# Patient Record
Sex: Female | Born: 2000 | Race: White | Hispanic: No | Marital: Single | Smoking: Never smoker
Health system: Southern US, Community
[De-identification: ages and names within clinical notes are randomized; demographics above are authoritative.]

## PROBLEM LIST (undated history)

## (undated) DIAGNOSIS — D6851 Activated protein C resistance: Secondary | ICD-10-CM

## (undated) DIAGNOSIS — N926 Irregular menstruation, unspecified: Secondary | ICD-10-CM

## (undated) HISTORY — DX: Irregular menstruation, unspecified: N92.6

## (undated) HISTORY — PX: KNEE SURGERY: SHX244

---
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
## 2019-02-08 ENCOUNTER — Other Ambulatory Visit: Payer: Self-pay

## 2019-02-08 ENCOUNTER — Encounter: Payer: Self-pay | Admitting: Emergency Medicine

## 2019-02-08 DIAGNOSIS — Z793 Long term (current) use of hormonal contraceptives: Secondary | ICD-10-CM | POA: Diagnosis not present

## 2019-02-08 DIAGNOSIS — T7840XA Allergy, unspecified, initial encounter: Secondary | ICD-10-CM | POA: Diagnosis not present

## 2019-02-08 DIAGNOSIS — R220 Localized swelling, mass and lump, head: Secondary | ICD-10-CM | POA: Diagnosis present

## 2019-02-08 MED ORDER — PREDNISONE 20 MG PO TABS
60.0000 mg | ORAL_TABLET | Freq: Once | ORAL | Status: AC
  Administered 2019-02-08: 60 mg via ORAL

## 2019-02-08 MED ORDER — DIPHENHYDRAMINE HCL 25 MG PO CAPS
25.0000 mg | ORAL_CAPSULE | Freq: Once | ORAL | Status: AC
  Administered 2019-02-08: 25 mg via ORAL

## 2019-02-08 MED ORDER — DIPHENHYDRAMINE HCL 25 MG PO CAPS
ORAL_CAPSULE | ORAL | Status: AC
  Filled 2019-02-08: qty 1

## 2019-02-08 NOTE — ED Triage Notes (Signed)
Pt in with co itching and puffiness to face. States around eyes and forehead. No shob noted, took 1 benadryl around 2100, unsure of cause.

## 2019-02-09 ENCOUNTER — Emergency Department
Admission: EM | Admit: 2019-02-09 | Discharge: 2019-02-09 | Disposition: A | Discharge status: Home / Self Care | Payer: BC Managed Care – PPO | Attending: Emergency Medicine | Admitting: Emergency Medicine

## 2019-02-09 DIAGNOSIS — T7840XA Allergy, unspecified, initial encounter: Secondary | ICD-10-CM

## 2019-02-09 NOTE — ED Provider Notes (Signed)
[facility] Emergency Department Provider Note  ____________________________________________  Time seen: Approximately 3:37 AM  I have reviewed the triage vital signs and the nursing notes.   HISTORY  Chief Complaint Allergic Reaction   HPI Kimberly Bradford is a 18 y.o. female who presents for evaluation of an allergic reaction.  Patient reports that started at 8 PM.  She noticed that her lips were swollen, her face looked puffy and itchy and she started having throat closing sensation.  She took 1 Benadryl.  30 minutes later she had dinner.  Her symptoms got slightly worse and she took a second Benadryl at 9 PM.  She reports full resolution of her symptoms at this time.  No prior history of anaphylactic reaction.  She denies shortness of breath or vomiting or hives.  She denies any new foods, medications, make-up, lotions.   Past Medical History:  Diagnosis Date  . Irregular periods     Patient Active Problem List   Diagnosis Date Noted  . Factor 5 Leiden mutation, heterozygous ([place]) 10/20/2017    Past Surgical History:  Procedure Laterality Date  . KNEE SURGERY      Prior to Admission medications   Medication Sig Start Date End Date Taking? Authorizing Provider  Levonorgestrel-Ethinyl Estradiol (AMETHIA,CAMRESE) 0.1-0.02 & 0.01 MG tablet Take 1 tablet by mouth daily. 10/12/17   Joylene Igo, CNM    Allergies Patient has no known allergies.  No family history on file.  Social History Social History   Tobacco Use  . Smoking status: Never Smoker  . Smokeless tobacco: Current User  Substance Use Topics  . Alcohol use: Not Currently  . Drug use: Not Currently    Review of Systems  Constitutional: Negative for fever. Eyes: Negative for visual changes. ENT: Negative for sore throat. + Throat closing sensation and lip swelling Neck: No neck pain  Cardiovascular: Negative for chest pain. Respiratory: Negative for shortness of  breath. Gastrointestinal: Negative for abdominal pain, vomiting or diarrhea. Genitourinary: Negative for dysuria. Musculoskeletal: Negative for back pain. Skin: Negative for rash. Neurological: Negative for headaches, weakness or numbness. Psych: No SI or HI  ____________________________________________   PHYSICAL EXAM:  VITAL SIGNS: ED Triage Vitals  Enc Vitals Group     BP 02/08/19 2339 116/79     Pulse Rate 02/08/19 2339 84     Resp 02/08/19 2339 16     Temp 02/08/19 2339 98.7 F (37.1 C)     Temp Source 02/08/19 2339 Oral     SpO2 02/08/19 2339 100 %     Weight 02/08/19 2339 110 lb (49.9 kg)     Height 02/08/19 2339 5\' 6"  (1.676 m)     Head Circumference --      Peak Flow --      Pain Score 02/08/19 2343 0     Pain Loc --      Pain Edu? --      Excl. in [place]? --     Constitutional: Alert and oriented. Well appearing and in no apparent distress. HEENT:      Head: Normocephalic and atraumatic.         Eyes: Conjunctivae are normal. Sclera is non-icteric.       Mouth/Throat: Mucous membranes are moist.  Oropharynx is clear with no tonsillar enlargement, uvula and tongue are normal, no stridor, airways patent, handling saliva with no difficulty, no angioedema      Neck: Supple with no signs of meningismus. Cardiovascular: Regular rate  and rhythm. No murmurs, gallops, or rubs. 2+ symmetrical distal pulses are present in all extremities. No JVD. Respiratory: Normal respiratory effort. Lungs are clear to auscultation bilaterally. No wheezes, crackles, or rhonchi.  Gastrointestinal: Soft, non tender, and non distended with positive bowel sounds. No rebound or guarding. Musculoskeletal: Nontender with normal range of motion in all extremities. No edema, cyanosis, or erythema of extremities. Neurologic: Normal speech and language. Face is symmetric. Moving all extremities. No gross focal neurologic deficits are appreciated. Skin: Skin is warm, dry and intact. No rash noted.  Psychiatric: Mood and affect are normal. Speech and behavior are normal.  ____________________________________________   LABS (all labs ordered are listed, but only abnormal results are displayed)  Labs Reviewed - No data to display ____________________________________________  EKG  none  ____________________________________________  RADIOLOGY  none  ____________________________________________   PROCEDURES  Procedure(s) performed: None Procedures Critical Care performed:  None ____________________________________________   INITIAL IMPRESSION / ASSESSMENT AND PLAN / ED COURSE   18 y.o. female who presents for evaluation of an allergic reaction to unknown allergen.  Symptoms resolved after 2 Benadryl.  Patient was monitored for 4 hours post Benadryl usage with no recurrent of her symptoms.  She was discharged home with an EpiPen.  Discussed indications for EpiPen usage.  Recommended follow-up with PCP for referral to an allergist.  Discussed my standard return precautions       As part of my medical decision making, I reviewed the following data within the electronic MEDICAL RECORD NUMBER Nursing notes reviewed and incorporated, Old chart reviewed, Notes from prior ED visits and [place] Controlled Substance Database   Please note:  Patient was evaluated in Emergency Department today for the symptoms described in the history of present illness. Patient was evaluated in the context of the global COVID-19 pandemic, which necessitated consideration that the patient might be at risk for infection with the SARS-CoV-2 virus that causes COVID-19. Institutional protocols and algorithms that pertain to the evaluation of patients at risk for COVID-19 are in a state of rapid change based on information released by regulatory bodies including the CDC and federal and state organizations. These policies and algorithms were followed during the patient's care in the ED.  Some ED evaluations and  interventions may be delayed as a result of limited staffing during the pandemic.   ____________________________________________   FINAL CLINICAL IMPRESSION(S) / ED DIAGNOSES   Final diagnoses:  Allergic reaction, initial encounter      NEW MEDICATIONS STARTED DURING THIS VISIT:  ED Discharge Orders    None       Note:  This document was prepared using Dragon voice recognition software and may include unintentional dictation errors.    Nita Sickle, MD 02/09/19 [phone]

## 2019-03-13 ENCOUNTER — Ambulatory Visit: Payer: BC Managed Care – PPO | Attending: Internal Medicine

## 2019-03-13 DIAGNOSIS — Z20822 Contact with and (suspected) exposure to covid-19: Secondary | ICD-10-CM | POA: Insufficient documentation

## 2019-03-14 LAB — NOVEL CORONAVIRUS, NAA: SARS-CoV-2, NAA: NOT DETECTED

## 2019-06-29 ENCOUNTER — Encounter: Payer: Self-pay | Admitting: Emergency Medicine

## 2019-06-29 ENCOUNTER — Other Ambulatory Visit: Payer: Self-pay

## 2019-06-29 ENCOUNTER — Emergency Department: Payer: BC Managed Care – PPO

## 2019-06-29 ENCOUNTER — Emergency Department
Admission: EM | Admit: 2019-06-29 | Discharge: 2019-06-29 | Disposition: A | Discharge status: Home / Self Care | Payer: BC Managed Care – PPO | Attending: Emergency Medicine | Admitting: Emergency Medicine

## 2019-06-29 DIAGNOSIS — R208 Other disturbances of skin sensation: Secondary | ICD-10-CM | POA: Diagnosis present

## 2019-06-29 DIAGNOSIS — M62830 Muscle spasm of back: Secondary | ICD-10-CM | POA: Diagnosis not present

## 2019-06-29 DIAGNOSIS — M5416 Radiculopathy, lumbar region: Secondary | ICD-10-CM | POA: Diagnosis not present

## 2019-06-29 DIAGNOSIS — R202 Paresthesia of skin: Secondary | ICD-10-CM | POA: Diagnosis not present

## 2019-06-29 DIAGNOSIS — M79604 Pain in right leg: Secondary | ICD-10-CM | POA: Diagnosis not present

## 2019-06-29 DIAGNOSIS — F17228 Nicotine dependence, chewing tobacco, with other nicotine-induced disorders: Secondary | ICD-10-CM | POA: Diagnosis not present

## 2019-06-29 DIAGNOSIS — R200 Anesthesia of skin: Secondary | ICD-10-CM | POA: Diagnosis not present

## 2019-06-29 HISTORY — DX: Activated protein C resistance: D68.51

## 2019-06-29 LAB — COMPREHENSIVE METABOLIC PANEL WITH GFR
ALT: 12 U/L (ref 0–44)
AST: 18 U/L (ref 15–41)
Albumin: 5.1 g/dL — ABNORMAL HIGH (ref 3.5–5.0)
Alkaline Phosphatase: 36 U/L — ABNORMAL LOW (ref 38–126)
Anion gap: 8 (ref 5–15)
BUN: 10 mg/dL (ref 6–20)
CO2: 26 mmol/L (ref 22–32)
Calcium: 9.5 mg/dL (ref 8.9–10.3)
Chloride: 105 mmol/L (ref 98–111)
Creatinine, Ser: 0.59 mg/dL (ref 0.44–1.00)
GFR calc Af Amer: >60 mL/min
GFR calc non Af Amer: >60 mL/min
Glucose, Bld: 91 mg/dL (ref 70–99)
Potassium: 3.9 mmol/L (ref 3.5–5.1)
Sodium: 139 mmol/L (ref 135–145)
Total Bilirubin: 0.8 mg/dL (ref 0.3–1.2)
Total Protein: 8 g/dL (ref 6.5–8.1)

## 2019-06-29 LAB — CBC WITH DIFFERENTIAL/PLATELET
Abs Immature Granulocytes: 0.02 10*3/uL (ref 0.00–0.07)
Basophils Absolute: 0.1 10*3/uL (ref 0.0–0.1)
Basophils Relative: 1 %
Eosinophils Absolute: 0.2 10*3/uL (ref 0.0–0.5)
Eosinophils Relative: 3 %
HCT: 37.9 % (ref 36.0–46.0)
Hemoglobin: 12.5 g/dL (ref 12.0–15.0)
Immature Granulocytes: 0 %
Lymphocytes Relative: 19 %
Lymphs Abs: 1.1 10*3/uL (ref 0.7–4.0)
MCH: 28.7 pg (ref 26.0–34.0)
MCHC: 33 g/dL (ref 30.0–36.0)
MCV: 87.1 fL (ref 80.0–100.0)
Monocytes Absolute: 0.7 10*3/uL (ref 0.1–1.0)
Monocytes Relative: 12 %
Neutro Abs: 3.8 10*3/uL (ref 1.7–7.7)
Neutrophils Relative %: 65 %
Platelets: 322 10*3/uL (ref 150–400)
RBC: 4.35 MIL/uL (ref 3.87–5.11)
RDW: 11.9 % (ref 11.5–15.5)
WBC: 5.8 10*3/uL (ref 4.0–10.5)
nRBC: 0 % (ref 0.0–0.2)

## 2019-06-29 MED ORDER — PREDNISONE 10 MG PO TABS: 10.0000 mg | ORAL_TABLET | Freq: Every day | ORAL | 0 refills | Status: AC

## 2019-06-29 MED ORDER — ONDANSETRON HCL 4 MG/2ML IJ SOLN
4.0000 mg | Freq: Once | INTRAMUSCULAR | Status: AC
  Administered 2019-06-29: 4 mg via INTRAVENOUS
  Filled 2019-06-29: qty 2

## 2019-06-29 MED ORDER — DEXAMETHASONE SODIUM PHOSPHATE 10 MG/ML IJ SOLN
10.0000 mg | Freq: Once | INTRAMUSCULAR | Status: AC
  Administered 2019-06-29: 10 mg via INTRAVENOUS
  Filled 2019-06-29: qty 1

## 2019-06-29 MED ORDER — MORPHINE SULFATE (PF) 4 MG/ML IV SOLN
4.0000 mg | Freq: Once | INTRAVENOUS | Status: AC
  Administered 2019-06-29: 4 mg via INTRAVENOUS
  Filled 2019-06-29: qty 1

## 2019-06-29 MED ORDER — ORPHENADRINE CITRATE 30 MG/ML IJ SOLN
60.0000 mg | Freq: Once | INTRAMUSCULAR | Status: AC
  Administered 2019-06-29: 60 mg via INTRAVENOUS
  Filled 2019-06-29: qty 2

## 2019-06-29 MED ORDER — CYCLOBENZAPRINE HCL 5 MG PO TABS: 5.0000 mg | ORAL_TABLET | Freq: Three times a day (TID) | ORAL | 0 refills | Status: AC | PRN

## 2019-06-29 NOTE — Discharge Instructions (Addendum)
Please alternate Tylenol and ibuprofen every 6 hours as needed.  You may use the muscle relaxer Flexeril 3 times daily as needed.  This medication will make you sleepy so do not drive while taking this medication.  If you continue to have the numbness tingling and radicular pain down the right leg tomorrow or over the next few days, you can start the prednisone taper.  If no numbness or tingling hold off on taking prednisone taper.  Return to the ER for any severe pain, inability to walk, weakness, loss of bowel or bladder function.  Follow-up with primary care provider in 1 week if symptoms have not resolved.

## 2019-06-29 NOTE — ED Provider Notes (Signed)
[facility] EMERGENCY DEPARTMENT Provider Note   CSN: [id number] Arrival date & time: 06/29/19  1417     History Chief Complaint  Patient presents with  . Leg Pain    Kimberly Bradford is a 19 y.o. female presents to the emergency department for evaluation of burning numbness and tingling in the right posterior lateral hip, anterior knee, lateral calf and into the big toe.  Symptoms began this morning.  She has a history of factor V Leiden clotting disorder as well as reports receiving Pfizer Covid vaccine yesterday.  She is concerned about a blood clot.  She denies any chest pain, shortness of breath or lower extremity swelling.  She has not had a medications for pain.  Her pain is moderate to severe.  She denies any trauma or injury or previous back surgery.  No history of sciatica.  No loss of bowel or bladder symptoms.  No fevers.  She does describe several days of tightness in her right lower back without any preceding trauma or injury.  HPI     Past Medical History:  Diagnosis Date  . Factor V Leiden mutation (HCC)   . Irregular periods     Patient Active Problem List   Diagnosis Date Noted  . Factor 5 Leiden mutation, heterozygous (HCC) 10/20/2017    Past Surgical History:  Procedure Laterality Date  . KNEE SURGERY       OB History    Gravida  0   Para  0   Term  0   Preterm  0   AB  0   Living  0     SAB  0   TAB  0   Ectopic  0   Multiple  0   Live Births  0           No family history on file.  Social History   Tobacco Use  . Smoking status: Never Smoker  . Smokeless tobacco: Current User  Substance Use Topics  . Alcohol use: Not Currently  . Drug use: Not Currently    Home Medications Prior to Admission medications   Medication Sig Start Date End Date Taking? Authorizing Provider  cyclobenzaprine (FLEXERIL) 5 MG tablet Take 1-2 tablets (5-10 mg total) by mouth 3 (three) times daily as needed for  muscle spasms. 06/29/19   Evon Slack, PA-C  Levonorgestrel-Ethinyl Estradiol (AMETHIA,CAMRESE) 0.1-0.02 & 0.01 MG tablet Take 1 tablet by mouth daily. 10/12/17   Shambley, Melody N, CNM  predniSONE (DELTASONE) 10 MG tablet Take 1 tablet (10 mg total) by mouth daily. 6,5,4,3,2,1 six day taper 06/29/19   Evon Slack, PA-C    Allergies    Patient has no known allergies.  Review of Systems   Review of Systems  Constitutional: Negative for chills and fever.  Respiratory: Negative for cough and shortness of breath.   Cardiovascular: Negative for chest pain and leg swelling.  Gastrointestinal: Negative for abdominal pain, nausea and vomiting.  Musculoskeletal: Positive for back pain, gait problem and myalgias.  Neurological: Positive for numbness. Negative for weakness.    Physical Exam Updated Vital Signs BP 132/65 (BP Location: Left Arm)   Pulse 93   Temp 98.4 F (36.9 C) (Oral)   Resp 16   Ht 5\' 6"  (1.676 m)   Wt 49 kg   SpO2 100%   BMI 17.43 kg/m   Physical Exam Constitutional:      Appearance: She is well-developed.  HENT:  Head: Normocephalic and atraumatic.  Eyes:     Conjunctiva/sclera: Conjunctivae normal.  Cardiovascular:     Rate and Rhythm: Normal rate.  Pulmonary:     Effort: Pulmonary effort is normal. No respiratory distress.  Musculoskeletal:        General: Normal range of motion.     Cervical back: Normal range of motion.     Comments: Patient with tenderness along the right paravertebral muscles of the lumbar spine.  No spinous process tenderness.  Able to assist with hip flexion and knee flexion as well as maintain active knee extension.  She has normal ankle plantar flexion dorsiflexion.  Slightly diminished sensation along the top of the right foot.  There is no swelling warmth erythema or edema throughout both lower extremities.  No knee effusion.  Hip moves well with internal X rotation with very little discomfort.  Skin:    General: Skin is  warm.     Findings: No rash.  Neurological:     Mental Status: She is alert and oriented to person, place, and time.  Psychiatric:        Behavior: Behavior normal.        Thought Content: Thought content normal.     ED Results / Procedures / Treatments   Labs (all labs ordered are listed, but only abnormal results are displayed) Labs Reviewed  COMPREHENSIVE METABOLIC PANEL - Abnormal; Notable for the following components:      Result Value   Albumin 5.1 (*)    Alkaline Phosphatase 36 (*)    All other components within normal limits  CBC WITH DIFFERENTIAL/PLATELET    EKG None  Radiology US Venous Img Lower Unilateral Right  Result Date: 06/29/2019 CLINICAL DATA:  Shooting pain in the RIGHT leg EXAM: RIGHT LOWER EXTREMITY VENOUS DOPPLER ULTRASOUND TECHNIQUE: Gray-scale sonography with compression, as well as color and duplex ultrasound, were performed to evaluate the deep venous system(s) from the level of the common femoral vein through the popliteal and proximal calf veins. COMPARISON:  None. FINDINGS: VENOUS Normal compressibility of the common femoral, superficial femoral, and popliteal veins, as well as the visualized calf veins. Visualized portions of profunda femoral vein and great saphenous vein unremarkable. No filling defects to suggest DVT on grayscale or color Doppler imaging. Doppler waveforms show normal direction of venous flow, normal respiratory plasticity and response to augmentation. Limited views of the contralateral common femoral vein are unremarkable. OTHER None. Limitations: none IMPRESSION: Negative. Electronically Signed   By: Nolon Nations M.D.   On: 06/29/2019 15:29    Procedures Procedures (including critical care time)  Medications Ordered in ED Medications  ondansetron (ZOFRAN) injection 4 mg (4 mg Intravenous Given 06/29/19 1620)  morphine 4 MG/ML injection 4 mg (4 mg Intravenous Given 06/29/19 1623)  orphenadrine (NORFLEX) injection 60 mg (60 mg  Intravenous Given 06/29/19 1621)  dexamethasone (DECADRON) injection 10 mg (10 mg Intravenous Given 06/29/19 1624)    ED Course  I have reviewed the triage vital signs and the nursing notes.  Pertinent labs & imaging results that were available during my care of the patient were reviewed by me and considered in my medical decision making (see chart for details).    MDM Rules/Calculators/A&P                      19 year old female with right leg pain.  History and exam consistent with lumbar radiculopathy with right lower lumbar muscle spasms.  Due to factor V Leiden  and recent Covid and vaccination yesterday ultrasound of the lower extremity was obtained and negative for DVT.  CBC and BMP normal.  She is given IV medications for pain, muscle laxation and anti-inflammatory relief, all symptoms resolved.  She is able to stand and walk and bend with no pain or discomfort.  Post medication examination shows no weakness in the lower extremity, full active range of motion with no neurological deficits.  She will go home with mother, take Tylenol and ibuprofen as needed.  Flexeril as needed for muscle spasms and if any continued numbness and tingling tomorrow or over the next few days she can start prednisone taper.  They understand signs and symptoms return to ED for. Final Clinical Impression(s) / ED Diagnoses Final diagnoses:  Right lumbar radiculopathy  Spasm of lumbar paraspinous muscle    Rx / DC Orders ED Discharge Orders         Ordered    predniSONE (DELTASONE) 10 MG tablet  Daily     06/29/19 1713    cyclobenzaprine (FLEXERIL) 5 MG tablet  3 times daily PRN     06/29/19 1713           Ronnette Juniper 06/29/19 1716    Concha Se, MD 06/29/19 [phone]

## 2019-06-29 NOTE — ED Notes (Signed)
Labs and imaging ordered per Dr. Cyril Loosen

## 2019-06-29 NOTE — ED Triage Notes (Addendum)
Patient reports she has had pain, numbness and tingling to right leg since this morning. No known injury. States she has a clotting disorder and UC sent her here for DVT r/o. No history of clots. Pain worse with flexing foot. Pedal pulse palpated.   Patient states she go her second dose of Pfizer vaccine yesterday.

## 2019-06-29 NOTE — ED Notes (Signed)
See triage note  Presents with pain to right leg    She is having tingling and numbness   States main pt is in lower leg  But now is moving into entire leg  Unable to bear wt

## 2020-02-02 ENCOUNTER — Emergency Department
Admission: EM | Admit: 2020-02-02 | Discharge: 2020-02-02 | Disposition: A | Discharge status: Home / Self Care | Payer: BC Managed Care – PPO | Attending: Emergency Medicine | Admitting: Emergency Medicine

## 2020-02-02 ENCOUNTER — Encounter: Payer: Self-pay | Admitting: Emergency Medicine

## 2020-02-02 ENCOUNTER — Other Ambulatory Visit: Payer: Self-pay

## 2020-02-02 DIAGNOSIS — X58XXXA Exposure to other specified factors, initial encounter: Secondary | ICD-10-CM | POA: Insufficient documentation

## 2020-02-02 DIAGNOSIS — M7981 Nontraumatic hematoma of soft tissue: Secondary | ICD-10-CM | POA: Insufficient documentation

## 2020-02-02 DIAGNOSIS — T45515A Adverse effect of anticoagulants, initial encounter: Secondary | ICD-10-CM | POA: Insufficient documentation

## 2020-02-02 DIAGNOSIS — T148XXA Other injury of unspecified body region, initial encounter: Secondary | ICD-10-CM

## 2020-02-02 LAB — BASIC METABOLIC PANEL
Anion gap: 11 (ref 5–15)
BUN: 12 mg/dL (ref 6–20)
CO2: 26 mmol/L (ref 22–32)
Calcium: 9.5 mg/dL (ref 8.9–10.3)
Chloride: 103 mmol/L (ref 98–111)
Creatinine, Ser: 0.65 mg/dL (ref 0.44–1.00)
GFR, Estimated: >60 mL/min (ref >60)
Glucose, Bld: 111 mg/dL — ABNORMAL HIGH (ref 70–99)
Potassium: 3.6 mmol/L (ref 3.5–5.1)
Sodium: 140 mmol/L (ref 135–145)

## 2020-02-02 LAB — CBC WITH DIFFERENTIAL/PLATELET
Abs Immature Granulocytes: 0.01 10*3/uL (ref 0.00–0.07)
Basophils Absolute: 0.1 10*3/uL (ref 0.0–0.1)
Basophils Relative: 1 %
Eosinophils Absolute: 0.6 10*3/uL — ABNORMAL HIGH (ref 0.0–0.5)
Eosinophils Relative: 7 %
HCT: 38.7 % (ref 36.0–46.0)
Hemoglobin: 12.7 g/dL (ref 12.0–15.0)
Immature Granulocytes: 0 %
Lymphocytes Relative: 26 %
Lymphs Abs: 2.1 10*3/uL (ref 0.7–4.0)
MCH: 29 pg (ref 26.0–34.0)
MCHC: 32.8 g/dL (ref 30.0–36.0)
MCV: 88.4 fL (ref 80.0–100.0)
Monocytes Absolute: 0.7 10*3/uL (ref 0.1–1.0)
Monocytes Relative: 8 %
Neutro Abs: 4.6 10*3/uL (ref 1.7–7.7)
Neutrophils Relative %: 58 %
Platelets: 378 10*3/uL (ref 150–400)
RBC: 4.38 MIL/uL (ref 3.87–5.11)
RDW: 11.9 % (ref 11.5–15.5)
WBC: 7.9 10*3/uL (ref 4.0–10.5)
nRBC: 0 % (ref 0.0–0.2)

## 2020-02-02 LAB — PROTIME-INR
INR: 1 (ref 0.8–1.2)
Prothrombin Time: 12.5 seconds (ref 11.4–15.2)

## 2020-02-02 LAB — APTT: aPTT: 34 seconds (ref 24–36)

## 2020-02-02 NOTE — ED Provider Notes (Signed)
[facility] Emergency Department Provider Note  __________________________________________   I have reviewed the triage vital signs and the nursing notes.   HISTORY  Chief Complaint Bleeding/Bruising   History limited by: Not Limited   HPI Kimberly Bradford is a 19 y.o. female who presents to the emergency department today because of concerns for bruising noticed to her legs.  She states this is been going on since she started on Zoloft.  She denies any trauma to her legs.  The bruising is uncomfortable.  The patient called her prescribing doctors office and let them know about the bruising as well as the fact that she has factor V Leiden.  They did asked that she be evaluated in the emergency department although she is not sure what their specific concerns were.  They did state the bruising is a potential side effect of Zoloft.  Records reviewed. Per medical record review patient has a history of factor v leiden.  Past Medical History:  Diagnosis Date  . Factor V Leiden mutation (HCC)   . Irregular periods     Patient Active Problem List   Diagnosis Date Noted  . Factor 5 Leiden mutation, heterozygous (HCC) 10/20/2017    Past Surgical History:  Procedure Laterality Date  . KNEE SURGERY      Prior to Admission medications   Medication Sig Start Date End Date Taking? Authorizing Provider  cyclobenzaprine (FLEXERIL) 5 MG tablet Take 1-2 tablets (5-10 mg total) by mouth 3 (three) times daily as needed for muscle spasms. 06/29/19   Evon Slack, PA-C  Levonorgestrel-Ethinyl Estradiol (AMETHIA,CAMRESE) 0.1-0.02 & 0.01 MG tablet Take 1 tablet by mouth daily. 10/12/17   Shambley, Melody N, CNM  predniSONE (DELTASONE) 10 MG tablet Take 1 tablet (10 mg total) by mouth daily. 6,5,4,3,2,1 six day taper 06/29/19   Evon Slack, PA-C    Allergies Patient has no known allergies.  No family history on file.  Social History Social History    Tobacco Use  . Smoking status: Never Smoker  . Smokeless tobacco: Current User  Vaping Use  . Vaping Use: Never used  Substance Use Topics  . Alcohol use: Not Currently  . Drug use: Not Currently    Review of Systems Constitutional: No fever/chills Eyes: No visual changes. ENT: No sore throat. Cardiovascular: Denies chest pain. Respiratory: Denies shortness of breath. Gastrointestinal: No abdominal pain.  No nausea, no vomiting.  No diarrhea.   Genitourinary: Negative for dysuria. Musculoskeletal: Negative for back pain. Skin: Bruising to the legs.  Neurological: Negative for headaches, focal weakness or numbness.  ____________________________________________   PHYSICAL EXAM:  VITAL SIGNS: ED Triage Vitals  Enc Vitals Group     BP 02/02/20 1715 122/76     Pulse Rate 02/02/20 1715 81     Resp 02/02/20 1715 16     Temp 02/02/20 1715 99 F (37.2 C)     Temp Source 02/02/20 1715 Oral     SpO2 02/02/20 1715 99 %     Weight 02/02/20 1711 120 lb (54.4 kg)     Height 02/02/20 1711 5\' 6"  (1.676 m)     Head Circumference --      Peak Flow --      Pain Score 02/02/20 1710 7   Constitutional: Alert and oriented.  Eyes: Conjunctivae are normal.  ENT      Head: Normocephalic and atraumatic.      Nose: No congestion/rhinnorhea.      Mouth/Throat: Mucous membranes are  moist.      Neck: No stridor. Hematological/Lymphatic/Immunilogical: No cervical lymphadenopathy. Cardiovascular: Normal rate, regular rhythm.  No murmurs, rubs, or gallops.  Respiratory: Normal respiratory effort without tachypnea nor retractions. Breath sounds are clear and equal bilaterally. No wheezes/rales/rhonchi. Gastrointestinal: Soft and non tender. No rebound. No guarding.  Genitourinary: Deferred Musculoskeletal: Normal range of motion in all extremities. No lower extremity edema. Neurologic:  Normal speech and language. No gross focal neurologic deficits are appreciated.  Skin:  Multiple bruises  to bilateral legs. No erythema. No swelling. No hives.  Psychiatric: Mood and affect are normal. Speech and behavior are normal. Patient exhibits appropriate insight and judgment.  ____________________________________________    LABS (pertinent positives/negatives)  CBC wbc 7.9, hgb 12.7, plt 378 BMP wnl except glu 111 INR 1.0 ____________________________________________   EKG  None  ____________________________________________    RADIOLOGY  None   ____________________________________________   PROCEDURES  Procedures  ____________________________________________   INITIAL IMPRESSION / ASSESSMENT AND PLAN / ED COURSE  Pertinent labs & imaging results that were available during my care of the patient were reviewed by me and considered in my medical decision making (see chart for details).   Patient presented to the emergency department today because of concerns for leg bruising.  She came to the emergency department at the advice of her doctor who prescribed Zoloft.  They did state that bruising can be a side effect of Zoloft however given her history of factor V Leiden did want her evaluated in the emergency department.  Patient's hemoglobin and platelet count within normal limits.  Will add on PT and PTT.  PT and INR within normal limits.  Waiting PTT at time of signout.  ___________________________________________   FINAL CLINICAL IMPRESSION(S) / ED DIAGNOSES  Final diagnoses:  Bruising     Note: This dictation was prepared with Dragon dictation. Any transcriptional errors that result from this process are unintentional     Phineas Semen, MD 02/02/20 [phone]

## 2020-02-02 NOTE — Discharge Instructions (Addendum)
Please seek medical attention for any high fevers, chest pain, shortness of breath, change in behavior, persistent vomiting, bloody stool or any other new or concerning symptoms.  

## 2020-02-02 NOTE — ED Triage Notes (Signed)
Pt to ED via POV, pt states that she has hx/o Factor V Leiden Mutation. Pt reports that 2 months ago she started taking zoloft. Pt states that she has noticed random bruises showing up on both of her legs and is concerned that it is a reaction from her mediation. Pt states that the bruises are only on her legs and no where else. Pt also reports having an area behind her left knee that is red and painful when she tries to extend her leg. Pt is in NAD.

## 2021-11-20 IMAGING — US US EXTREM LOW VENOUS*R*
1 series · 14 of 24 positions shown · non-contrast
Comparison: None.

CLINICAL DATA: Shooting pain in the RIGHT leg

EXAM:
RIGHT LOWER EXTREMITY VENOUS DOPPLER ULTRASOUND
TECHNIQUE: Gray-scale sonography with compression, as well as color and duplex
ultrasound, were performed to evaluate the deep venous system(s)
from the level of the common femoral vein through the popliteal and
proximal calf veins.

[Series 1: us venous img lower uni right (dvt) · portal-venous · 14 of 27 slices shown]
[im 1/27]
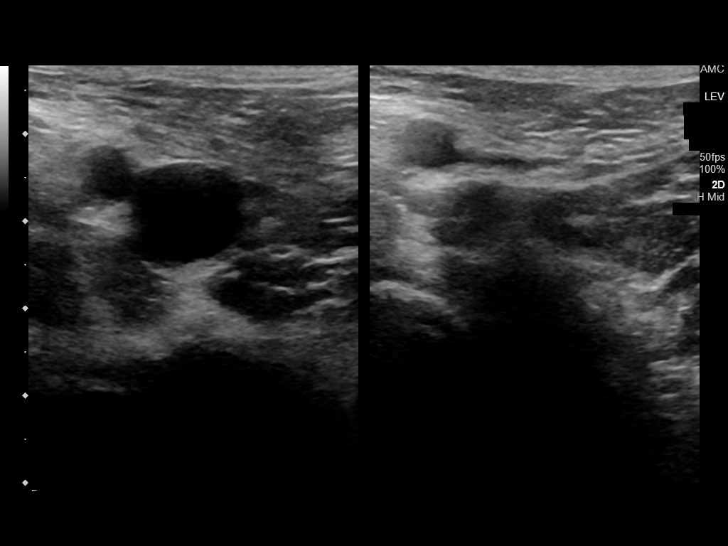
[im 3/27]
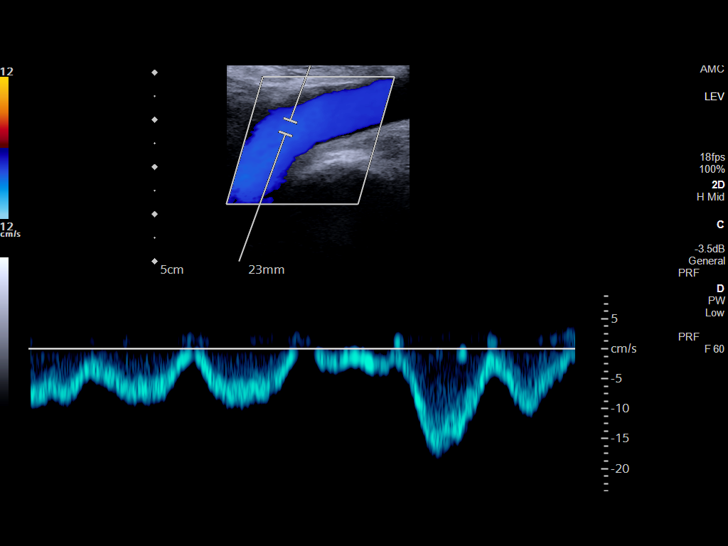
[im 5/27]
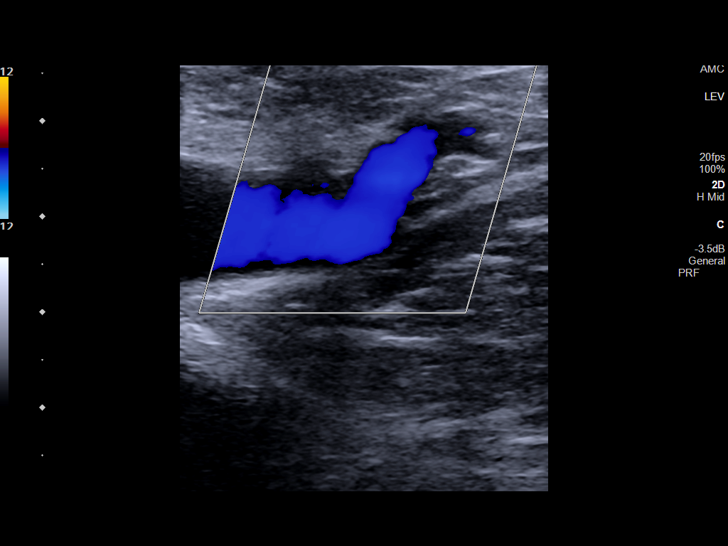
[im 7/27]
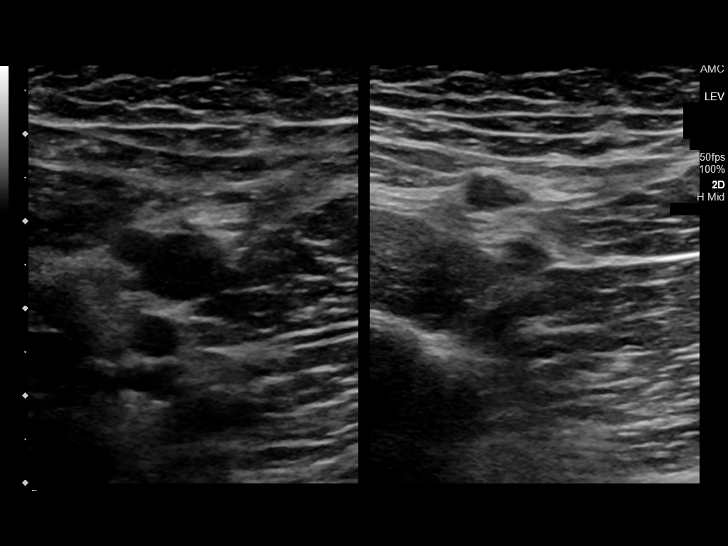
[im 8/27]
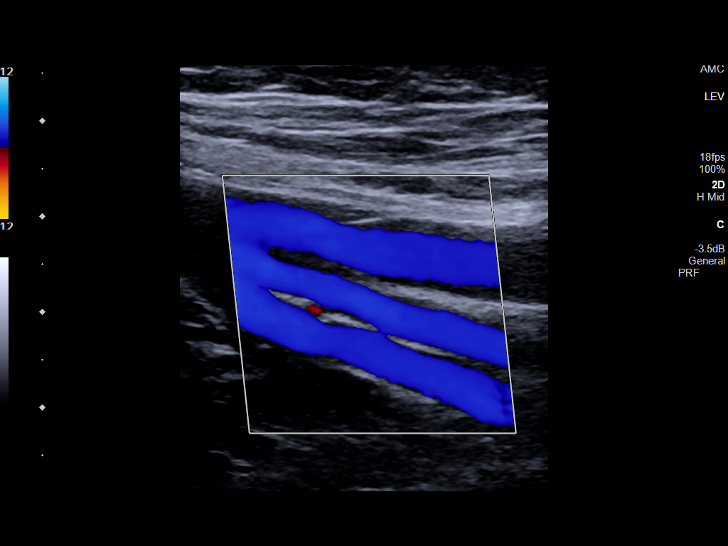
[im 11/27]
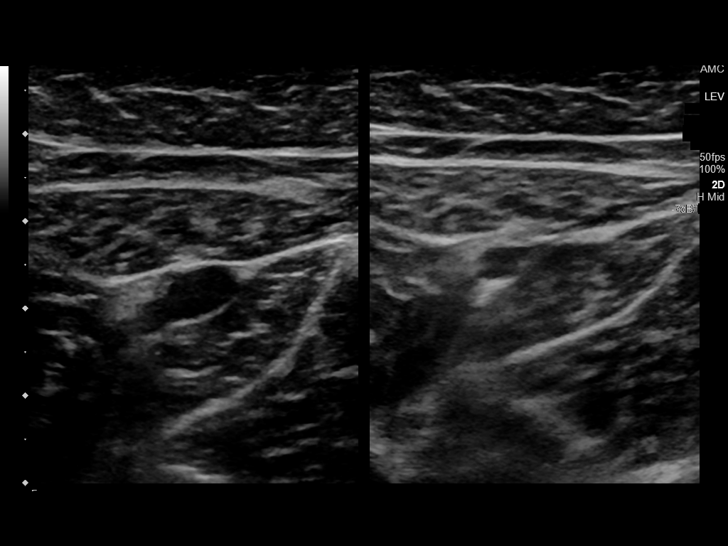
[im 13/27]
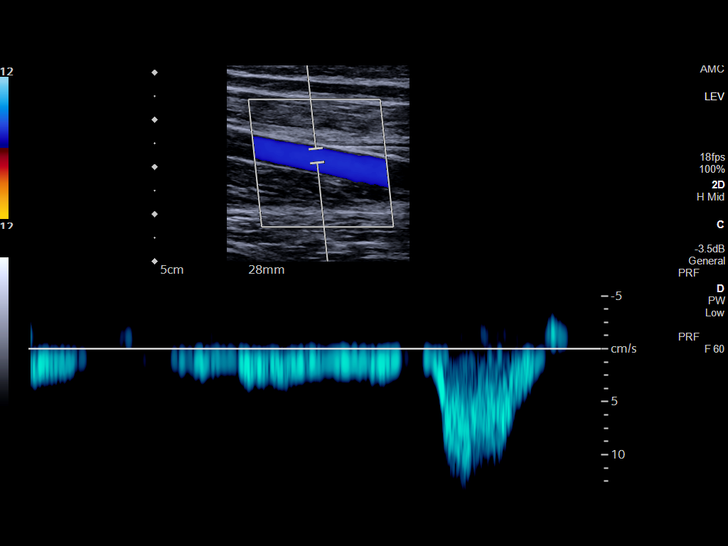
[im 14/27]
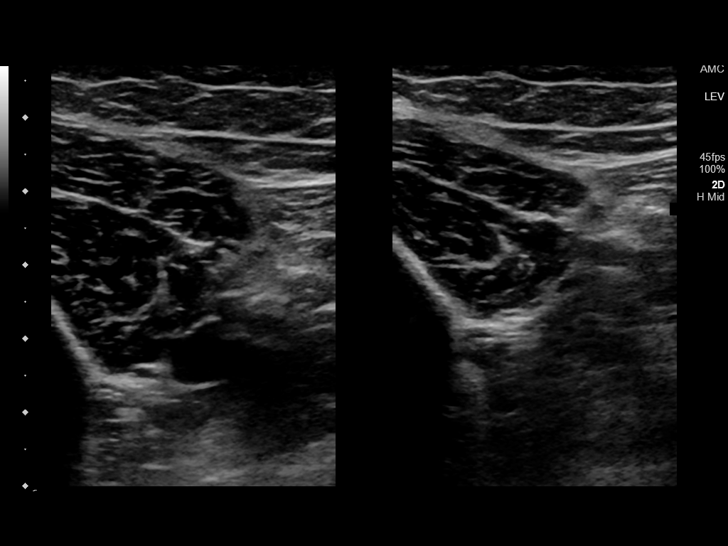
[im 16/27]
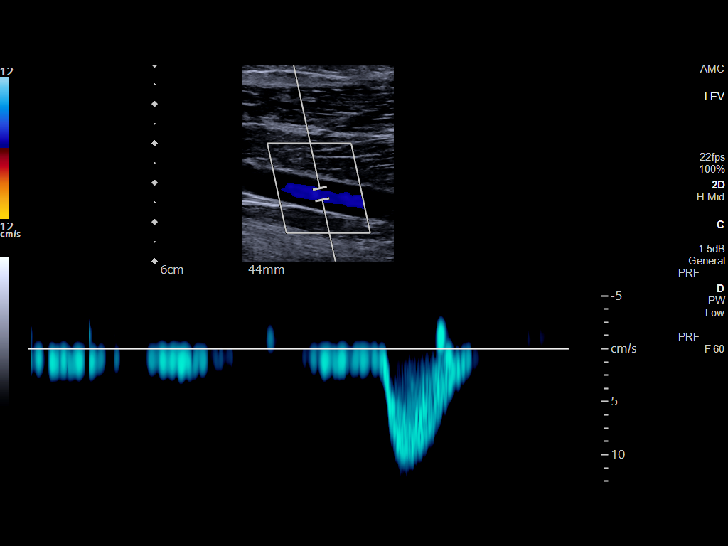
[im 19/27]
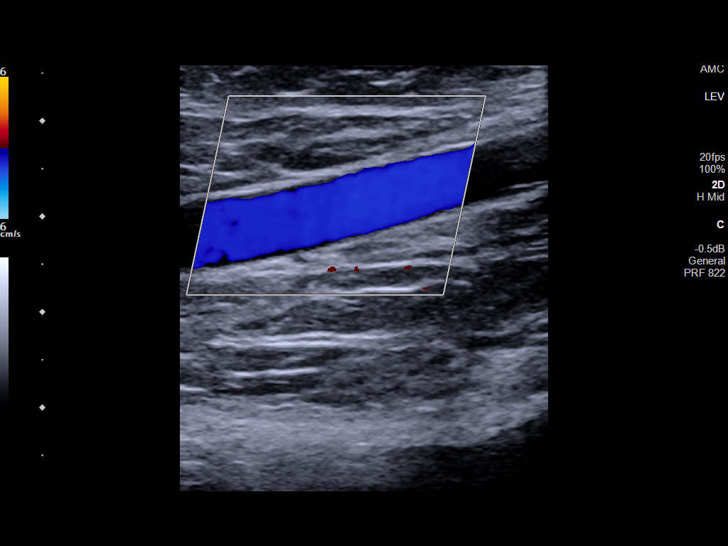
[im 21/27]
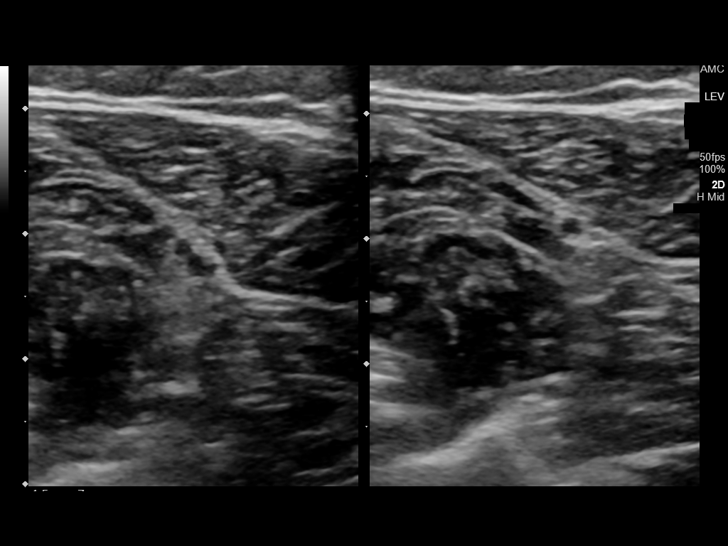
[im 22/27]
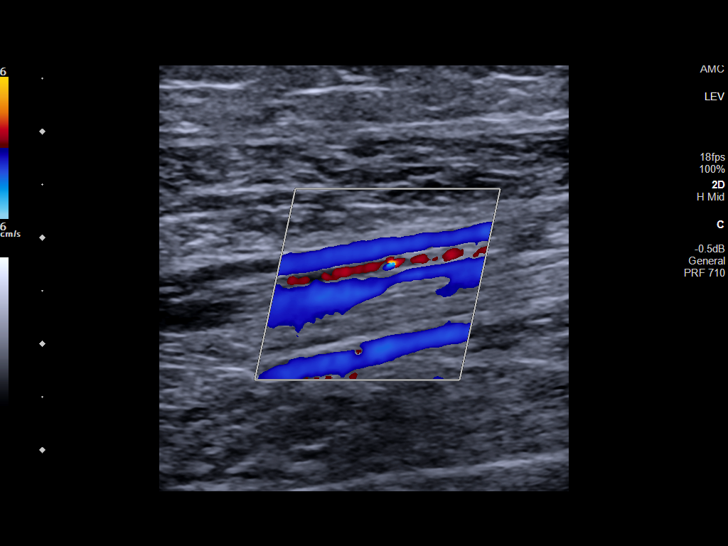
[im 24/27]
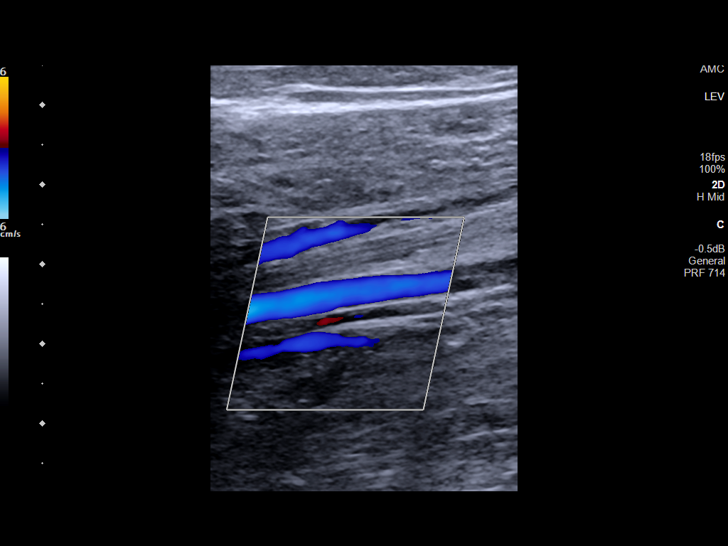
[im 27/27]
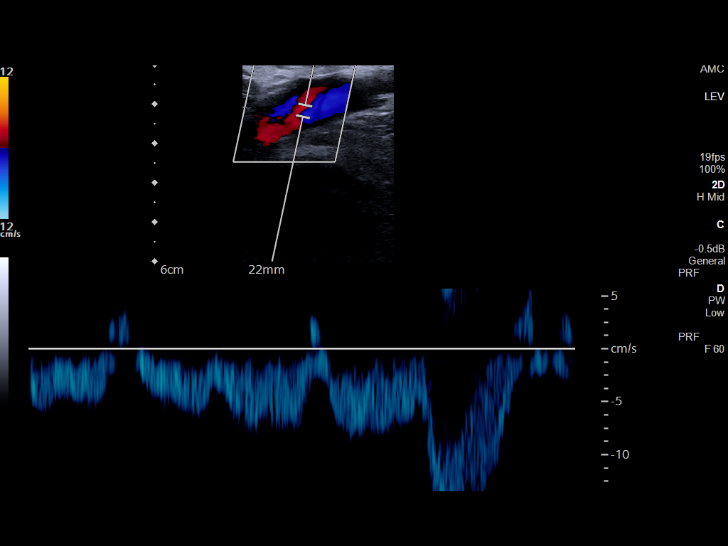

[14 of 24 positions shown; findings below may reference images not displayed]

FINDINGS: VENOUS

Normal compressibility of the common femoral, superficial femoral,
and popliteal veins, as well as the visualized calf veins.
Visualized portions of profunda femoral vein and great saphenous
vein unremarkable. No filling defects to suggest DVT on grayscale or
color Doppler imaging. Doppler waveforms show normal direction of
venous flow, normal respiratory plasticity and response to
augmentation.

Limited views of the contralateral common femoral vein are
unremarkable.

OTHER

None.

Limitations: none
IMPRESSION: Negative.

## 2023-02-22 ENCOUNTER — Other Ambulatory Visit: Payer: Self-pay | Admitting: Certified Nurse Midwife

## 2023-02-22 DIAGNOSIS — N644 Mastodynia: Secondary | ICD-10-CM

## 2023-03-11 ENCOUNTER — Ambulatory Visit
Admission: RE | Admit: 2023-03-11 | Discharge: 2023-03-11 | Disposition: A | Discharge status: Home / Self Care | Payer: BC Managed Care – PPO | Source: Ambulatory Visit | Attending: Certified Nurse Midwife | Admitting: Certified Nurse Midwife

## 2023-03-11 DIAGNOSIS — N644 Mastodynia: Secondary | ICD-10-CM | POA: Insufficient documentation
